# Patient Record
Sex: Female | Born: 2000 | Race: White | Hispanic: Yes | Marital: Single | Smoking: Never smoker
Health system: Southern US, Community
[De-identification: ages and names within clinical notes are randomized; demographics above are authoritative.]

---
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
## 2020-09-24 NOTE — L&D Delivery Note (Signed)
Delivery Note Progressed to complete dilation.  AROM clear fluid.  Pushed effectively to SVD  At 5:18 AM a viable and healthy female was delivered via Vaginal, Spontaneous (Presentation: Left Occiput Anterior).  APGAR: 9, 9; weight  .   Placenta status: Spontaneous,  .  Cord: 3 vessels with the following complications: None.    Anesthesia: Epidural Episiotomy: None Lacerations: 1st degree;Labial (bilateral) Suture Repair: 3.0 vicryl rapide Est. Blood Loss (mL): 300  Mom to postpartum.  Baby to Couplet care / Skin to Skin.  Alicia Jackson 06/25/2021, 6:26 AM   Please schedule this patient for Postpartum visit in: 4 weeks with the following provider: Any provider In-Person For C/S patients schedule nurse incision check in weeks 2 weeks: no Low risk pregnancy complicated by:  SGA Delivery mode:  SVD Anticipated Birth Control:  other/unsure PP Procedures needed:  none   Edinburgh: negative Schedule Integrated BH visit: no  No relevant baby issues

## 2020-10-26 IMAGING — US US OB LIMITED
1 series · 14 of 28 positions shown · non-contrast
Comparison: none

CLINICAL DATA: Initial evaluation for lower abdominal cramping,
early 18 weeks pregnant.

EXAM:
LIMITED OBSTETRIC ULTRASOUND

[Series 1: us ob limited · 35 acquisitions, 14 frames shown]
[im 2/35]
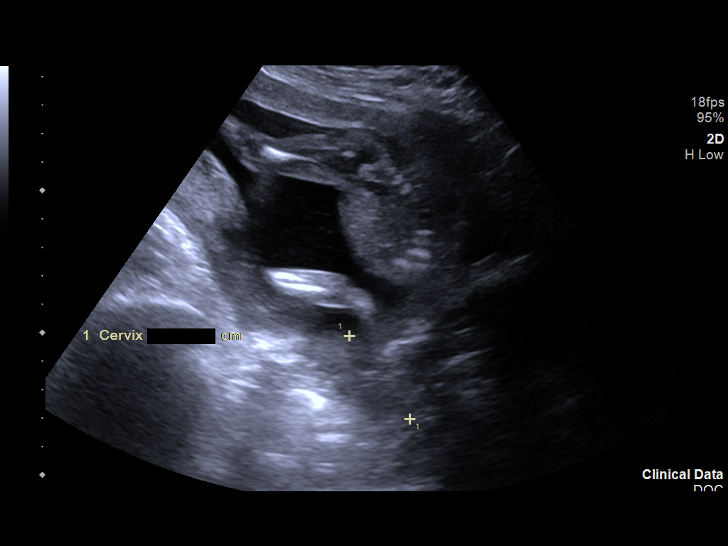
[im 4/35]
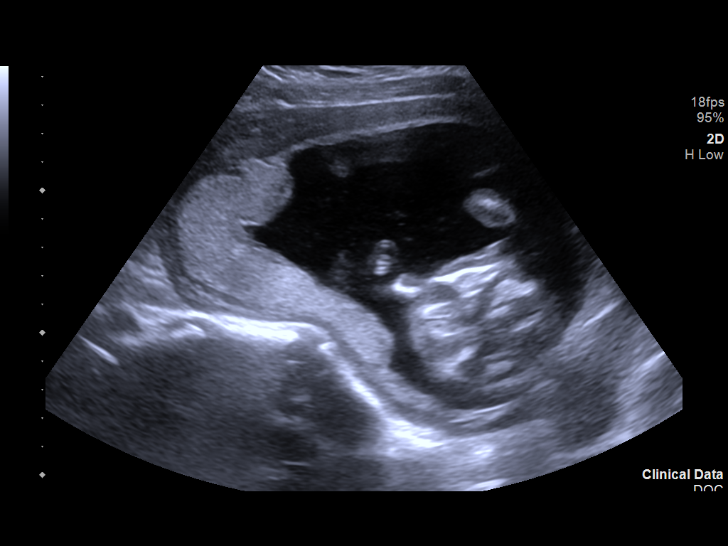
[im 7/35]
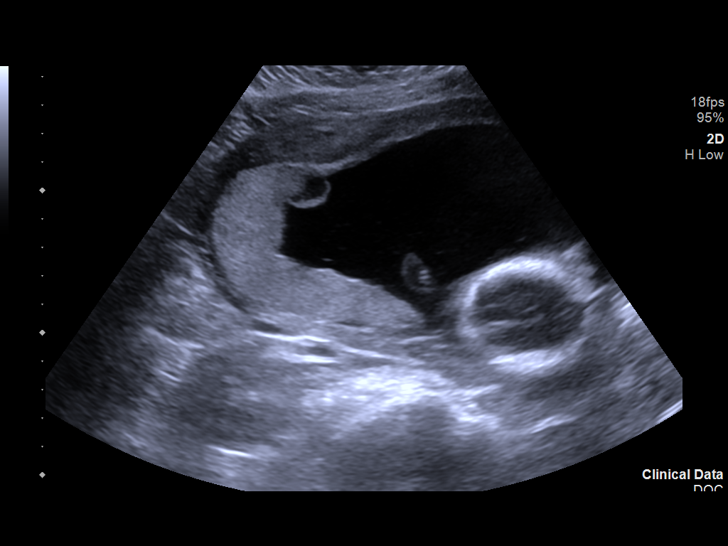
[im 9/35]
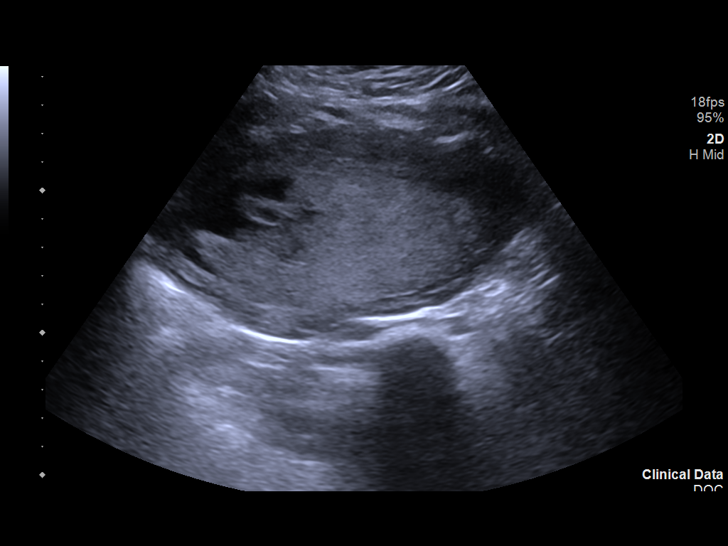
[im 12/35]
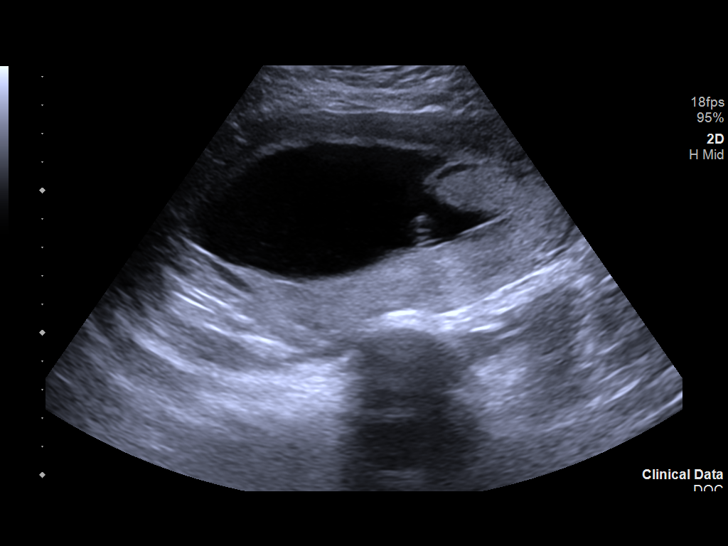
[im 14/35]
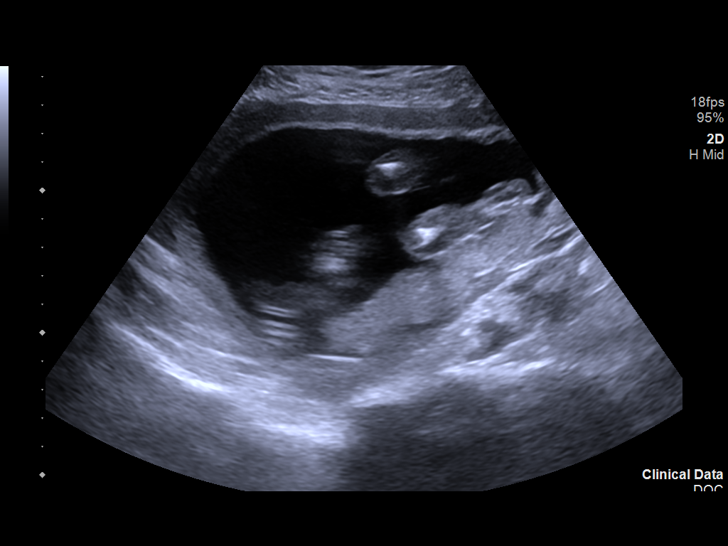
[im 17/35]
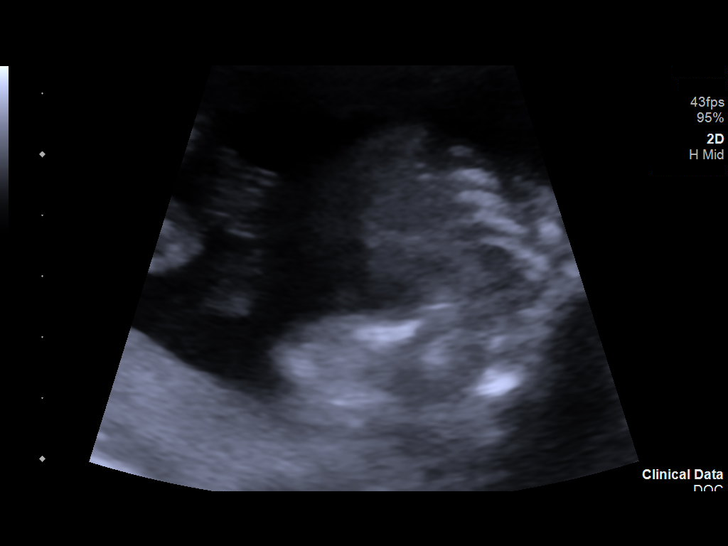
[im 19/35]
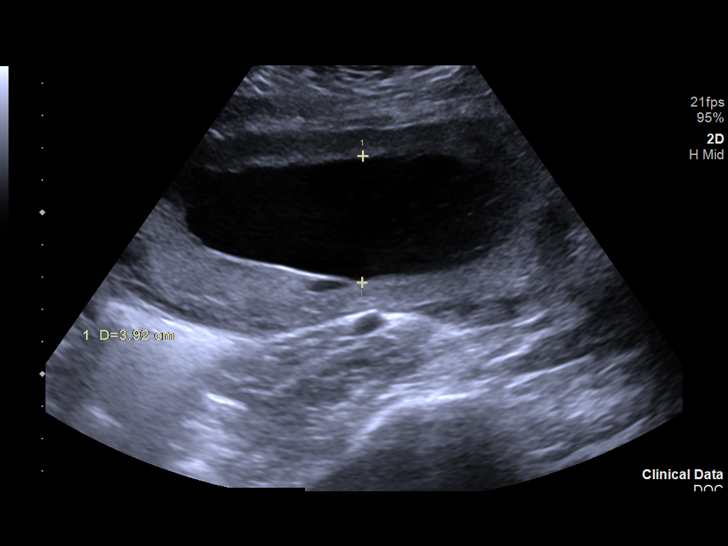
[im 22/35]
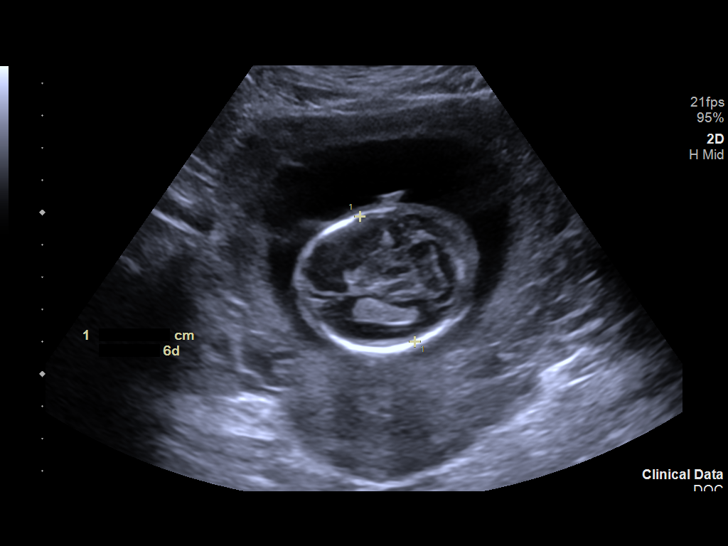
[im 24/35]
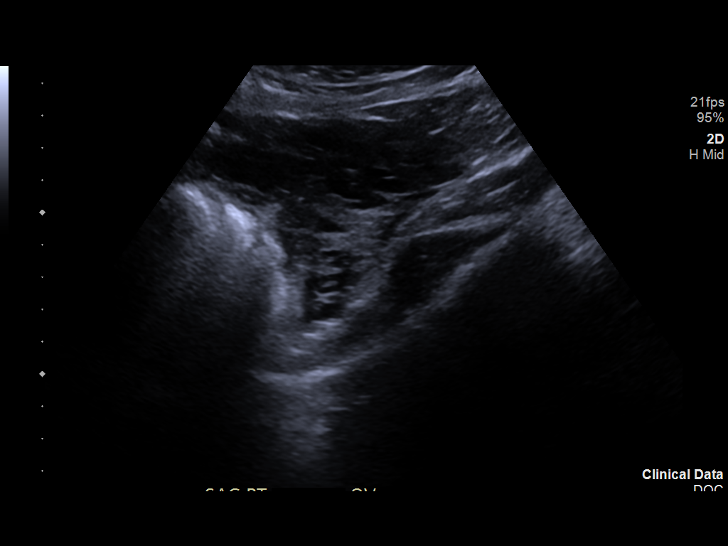
[im 27/35]
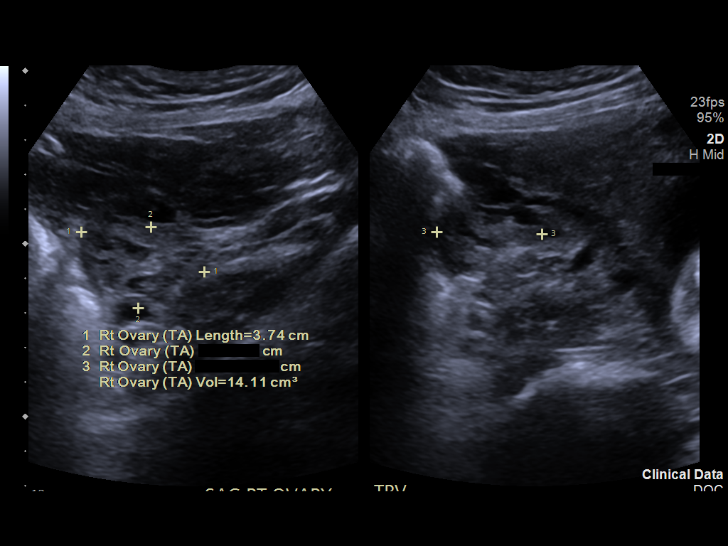
[im 29/35]
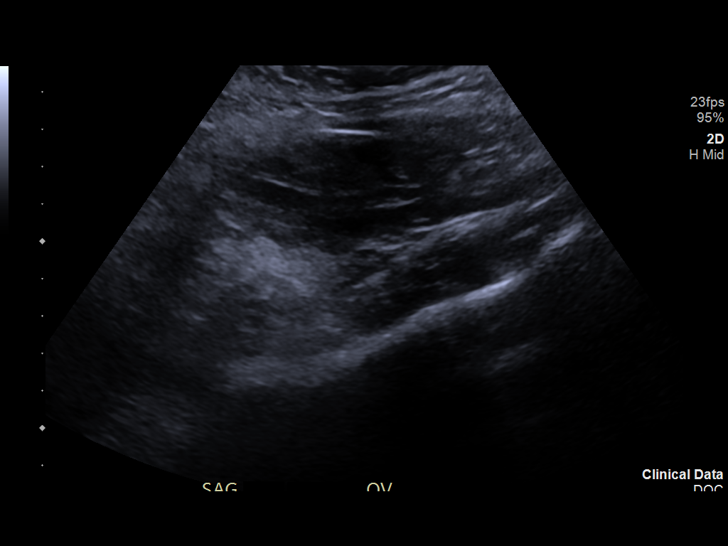
[im 32/35]
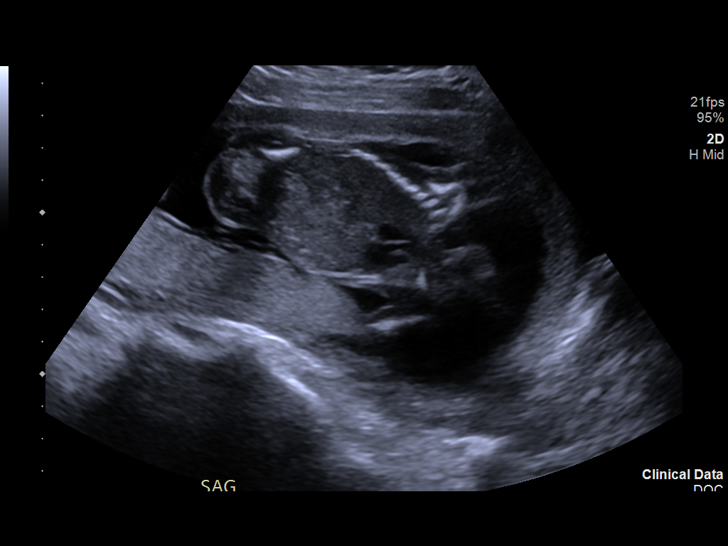
[im 35/35]
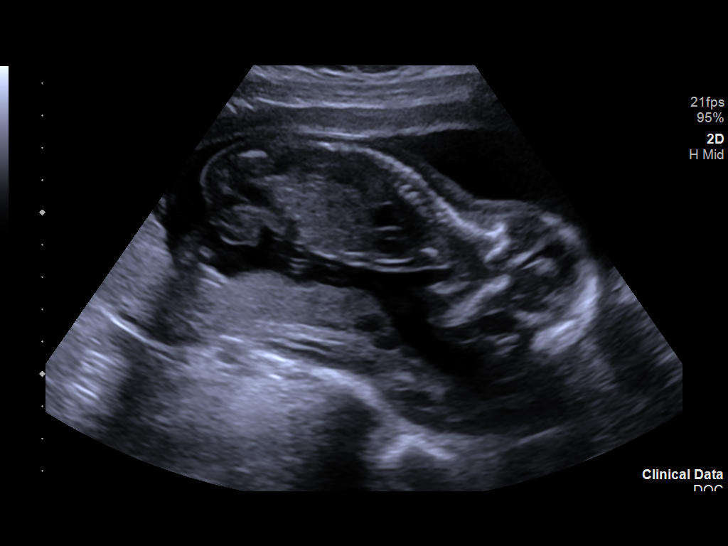

[14 of 28 positions shown; findings below may reference images not displayed]

FINDINGS: Number of Fetuses: 1

Heart Rate:  131 bpm

Movement: Present

Presentation: Cephalic

Placental Location: Fundal

Previa: Negative

Amniotic Fluid (Subjective):  Within normal limits.

AFI: 3.9 cm

BPD: 4.3 cm 18 w  6 d

MATERNAL FINDINGS:

Cervix:  Appears closed.

Uterus/Adnexae: No abnormality visualized. Right ovary normal in
appearance. Left ovary not visualized.
IMPRESSION: Single viable intrauterine pregnancy as above. No complication
identified.

This exam is performed on an emergent basis and does not
comprehensively evaluate fetal size, dating, or anatomy; follow-up
complete OB US should be considered if further fetal assessment is
warranted.

## 2021-01-10 LAB — OB RESULTS CONSOLE HEPATITIS B SURFACE ANTIGEN: Hepatitis B Surface Ag: NEGATIVE

## 2021-01-10 LAB — OB RESULTS CONSOLE RUBELLA ANTIBODY, IGM: Rubella: IMMUNE

## 2021-01-10 LAB — OB RESULTS CONSOLE RPR: RPR: NONREACTIVE

## 2021-01-10 LAB — OB RESULTS CONSOLE VARICELLA ZOSTER ANTIBODY, IGG: Varicella: IMMUNE

## 2021-01-10 LAB — OB RESULTS CONSOLE HIV ANTIBODY (ROUTINE TESTING): HIV: NONREACTIVE

## 2021-05-11 DIAGNOSIS — O26843 Uterine size-date discrepancy, third trimester: Secondary | ICD-10-CM | POA: Diagnosis present

## 2021-06-02 LAB — OB RESULTS CONSOLE GBS: GBS: NEGATIVE

## 2021-06-25 ENCOUNTER — Inpatient Hospital Stay (HOSPITAL_COMMUNITY)
Admission: AD | Admit: 2021-06-25 | Discharge: 2021-06-26 | DRG: 807 | Disposition: A | Discharge status: Home / Self Care | Payer: Medicaid Other | Attending: Obstetrics and Gynecology | Admitting: Obstetrics and Gynecology

## 2021-06-25 ENCOUNTER — Encounter (HOSPITAL_COMMUNITY): Payer: Self-pay | Admitting: Obstetrics and Gynecology

## 2021-06-25 ENCOUNTER — Other Ambulatory Visit: Payer: Self-pay

## 2021-06-25 ENCOUNTER — Inpatient Hospital Stay (HOSPITAL_COMMUNITY): Payer: Medicaid Other | Admitting: Anesthesiology

## 2021-06-25 DIAGNOSIS — Z20822 Contact with and (suspected) exposure to covid-19: Secondary | ICD-10-CM | POA: Diagnosis present

## 2021-06-25 DIAGNOSIS — O479 False labor, unspecified: Secondary | ICD-10-CM | POA: Diagnosis present

## 2021-06-25 DIAGNOSIS — Z3A39 39 weeks gestation of pregnancy: Secondary | ICD-10-CM

## 2021-06-25 DIAGNOSIS — O365930 Maternal care for other known or suspected poor fetal growth, third trimester, not applicable or unspecified: Principal | ICD-10-CM | POA: Diagnosis present

## 2021-06-25 DIAGNOSIS — O26843 Uterine size-date discrepancy, third trimester: Secondary | ICD-10-CM | POA: Diagnosis present

## 2021-06-25 DIAGNOSIS — O26893 Other specified pregnancy related conditions, third trimester: Secondary | ICD-10-CM | POA: Diagnosis present

## 2021-06-25 LAB — COMPREHENSIVE METABOLIC PANEL
ALT: 19 U/L (ref 0–44)
AST: 26 U/L (ref 15–41)
Albumin: 3.2 g/dL — ABNORMAL LOW (ref 3.5–5.0)
Alkaline Phosphatase: 172 U/L — ABNORMAL HIGH (ref 38–126)
Anion gap: 14 (ref 5–15)
BUN: 8 mg/dL (ref 6–20)
CO2: 15 mmol/L — ABNORMAL LOW (ref 22–32)
Calcium: 9 mg/dL (ref 8.9–10.3)
Chloride: 108 mmol/L (ref 98–111)
Creatinine, Ser: 0.5 mg/dL (ref 0.44–1.00)
GFR, Estimated: >60 mL/min (ref >60)
Glucose, Bld: 92 mg/dL (ref 70–99)
Potassium: 3.3 mmol/L — ABNORMAL LOW (ref 3.5–5.1)
Sodium: 137 mmol/L (ref 135–145)
Total Bilirubin: 0.6 mg/dL (ref 0.3–1.2)
Total Protein: 6.7 g/dL (ref 6.5–8.1)

## 2021-06-25 LAB — RESP PANEL BY RT-PCR (FLU A&B, COVID) ARPGX2
Influenza A by PCR: NEGATIVE
Influenza B by PCR: NEGATIVE
SARS Coronavirus 2 by RT PCR: NEGATIVE

## 2021-06-25 LAB — TYPE AND SCREEN
ABO/RH(D): A POS
Antibody Screen: NEGATIVE

## 2021-06-25 LAB — CBC
HCT: 38.4 % (ref 36.0–46.0)
Hemoglobin: 13.3 g/dL (ref 12.0–15.0)
MCH: 26.2 pg (ref 26.0–34.0)
MCHC: 34.6 g/dL (ref 30.0–36.0)
MCV: 75.7 fL — ABNORMAL LOW (ref 80.0–100.0)
Platelets: 273 10*3/uL (ref 150–400)
RBC: 5.07 MIL/uL (ref 3.87–5.11)
RDW: 14.6 % (ref 11.5–15.5)
WBC: 9.5 10*3/uL (ref 4.0–10.5)
nRBC: 0 % (ref 0.0–0.2)

## 2021-06-25 LAB — RPR: RPR Ser Ql: NONREACTIVE

## 2021-06-25 MED ORDER — ONDANSETRON HCL 4 MG PO TABS: 4.0000 mg | ORAL_TABLET | ORAL | Status: DC | PRN

## 2021-06-25 MED ORDER — LACTATED RINGERS IV SOLN: 500.0000 mL | Freq: Once | INTRAVENOUS | Status: DC

## 2021-06-25 MED ORDER — ONDANSETRON HCL 4 MG/2ML IJ SOLN: 4.0000 mg | INTRAMUSCULAR | Status: DC | PRN

## 2021-06-25 MED ORDER — SOD CITRATE-CITRIC ACID 500-334 MG/5ML PO SOLN
30.0000 mL | ORAL | Status: DC | PRN
Start: 2021-06-25 — End: 2021-06-25

## 2021-06-25 MED ORDER — ACETAMINOPHEN 325 MG PO TABS
650.0000 mg | ORAL_TABLET | ORAL | Status: DC | PRN
  Administered 2021-06-25: 650 mg via ORAL
  Filled 2021-06-25: qty 2

## 2021-06-25 MED ORDER — DIPHENHYDRAMINE HCL 25 MG PO CAPS: 25.0000 mg | ORAL_CAPSULE | Freq: Four times a day (QID) | ORAL | Status: DC | PRN

## 2021-06-25 MED ORDER — FENTANYL-BUPIVACAINE-NACL 0.5-0.125-0.9 MG/250ML-% EP SOLN
12.0000 mL/h | EPIDURAL | Status: DC | PRN
  Administered 2021-06-25: 10 mL/h via EPIDURAL
  Filled 2021-06-25: qty 250

## 2021-06-25 MED ORDER — EPHEDRINE 5 MG/ML INJ: 10.0000 mg | INTRAVENOUS | Status: DC | PRN

## 2021-06-25 MED ORDER — LACTATED RINGERS IV SOLN
500.0000 mL | Freq: Once | INTRAVENOUS | Status: AC
  Administered 2021-06-25: 500 mL via INTRAVENOUS

## 2021-06-25 MED ORDER — PHENYLEPHRINE 40 MCG/ML (10ML) SYRINGE FOR IV PUSH (FOR BLOOD PRESSURE SUPPORT)
80.0000 ug | PREFILLED_SYRINGE | INTRAVENOUS | Status: DC | PRN
Start: 2021-06-25 — End: 2021-06-25

## 2021-06-25 MED ORDER — OXYCODONE-ACETAMINOPHEN 5-325 MG PO TABS: 1.0000 | ORAL_TABLET | ORAL | Status: DC | PRN

## 2021-06-25 MED ORDER — BENZOCAINE-MENTHOL 20-0.5 % EX AERO
1.0000 "application " | INHALATION_SPRAY | CUTANEOUS | Status: DC | PRN
  Administered 2021-06-25: 1 via TOPICAL
  Filled 2021-06-25: qty 56

## 2021-06-25 MED ORDER — OXYTOCIN BOLUS FROM INFUSION
333.0000 mL | Freq: Once | INTRAVENOUS | Status: AC
  Administered 2021-06-25: 333 mL via INTRAVENOUS

## 2021-06-25 MED ORDER — ACETAMINOPHEN 325 MG PO TABS: 650.0000 mg | ORAL_TABLET | ORAL | Status: DC | PRN

## 2021-06-25 MED ORDER — DIPHENHYDRAMINE HCL 50 MG/ML IJ SOLN
12.5000 mg | INTRAMUSCULAR | Status: DC | PRN
Start: 2021-06-25 — End: 2021-06-25

## 2021-06-25 MED ORDER — PHENYLEPHRINE 40 MCG/ML (10ML) SYRINGE FOR IV PUSH (FOR BLOOD PRESSURE SUPPORT)
80.0000 ug | PREFILLED_SYRINGE | INTRAVENOUS | Status: DC | PRN
  Filled 2021-06-25: qty 10

## 2021-06-25 MED ORDER — BUPIVACAINE HCL (PF) 0.25 % IJ SOLN
INTRAMUSCULAR | Status: DC | PRN
  Administered 2021-06-25: 1 mL via INTRATHECAL

## 2021-06-25 MED ORDER — DIBUCAINE (PERIANAL) 1 % EX OINT
1.0000 | TOPICAL_OINTMENT | CUTANEOUS | Status: DC | PRN
Start: 2021-06-25 — End: 2021-06-26

## 2021-06-25 MED ORDER — IBUPROFEN 600 MG PO TABS
600.0000 mg | ORAL_TABLET | Freq: Four times a day (QID) | ORAL | Status: DC
Start: 2021-06-25 — End: 2021-06-26
  Administered 2021-06-25 – 2021-06-26 (×5): 600 mg via ORAL
  Filled 2021-06-25 (×5): qty 1

## 2021-06-25 MED ORDER — LACTATED RINGERS IV SOLN
INTRAVENOUS | Status: DC
Start: 2021-06-25 — End: 2021-06-25

## 2021-06-25 MED ORDER — ONDANSETRON HCL 4 MG/2ML IJ SOLN: 4.0000 mg | Freq: Four times a day (QID) | INTRAMUSCULAR | Status: DC | PRN

## 2021-06-25 MED ORDER — SENNOSIDES-DOCUSATE SODIUM 8.6-50 MG PO TABS
2.0000 | ORAL_TABLET | ORAL | Status: DC
Start: 2021-06-25 — End: 2021-06-26
  Administered 2021-06-25 – 2021-06-26 (×2): 2 via ORAL
  Filled 2021-06-25 (×2): qty 2

## 2021-06-25 MED ORDER — FENTANYL CITRATE (PF) 100 MCG/2ML IJ SOLN: 100.0000 ug | Freq: Once | INTRAMUSCULAR | Status: DC

## 2021-06-25 MED ORDER — LIDOCAINE HCL (PF) 1 % IJ SOLN
30.0000 mL | INTRAMUSCULAR | Status: DC | PRN
Start: 2021-06-25 — End: 2021-06-25

## 2021-06-25 MED ORDER — ZOLPIDEM TARTRATE 5 MG PO TABS: 5.0000 mg | ORAL_TABLET | Freq: Every evening | ORAL | Status: DC | PRN

## 2021-06-25 MED ORDER — OXYCODONE-ACETAMINOPHEN 5-325 MG PO TABS: 2.0000 | ORAL_TABLET | ORAL | Status: DC | PRN

## 2021-06-25 MED ORDER — COCONUT OIL OIL: 1.0000 "application " | TOPICAL_OIL | Status: DC | PRN

## 2021-06-25 MED ORDER — OXYTOCIN-SODIUM CHLORIDE 30-0.9 UT/500ML-% IV SOLN
2.5000 [IU]/h | INTRAVENOUS | Status: DC
  Administered 2021-06-25: 2.5 [IU]/h via INTRAVENOUS

## 2021-06-25 MED ORDER — TETANUS-DIPHTH-ACELL PERTUSSIS 5-2.5-18.5 LF-MCG/0.5 IM SUSY: 0.5000 mL | PREFILLED_SYRINGE | Freq: Once | INTRAMUSCULAR | Status: DC

## 2021-06-25 MED ORDER — WITCH HAZEL-GLYCERIN EX PADS
1.0000 | MEDICATED_PAD | CUTANEOUS | Status: DC | PRN
Start: 2021-06-25 — End: 2021-06-26

## 2021-06-25 MED ORDER — OXYTOCIN-SODIUM CHLORIDE 30-0.9 UT/500ML-% IV SOLN
INTRAVENOUS | Status: AC
  Filled 2021-06-25: qty 500

## 2021-06-25 MED ORDER — LACTATED RINGERS IV SOLN: 500.0000 mL | INTRAVENOUS | Status: DC | PRN

## 2021-06-25 MED ORDER — FENTANYL CITRATE (PF) 100 MCG/2ML IJ SOLN
INTRAMUSCULAR | Status: AC
  Administered 2021-06-25: 100 ug
  Filled 2021-06-25: qty 2

## 2021-06-25 MED ORDER — PRENATAL MULTIVITAMIN CH
1.0000 | ORAL_TABLET | Freq: Every day | ORAL | Status: DC
  Administered 2021-06-25 – 2021-06-26 (×2): 1 via ORAL
  Filled 2021-06-25 (×2): qty 1

## 2021-06-25 MED ORDER — SIMETHICONE 80 MG PO CHEW: 80.0000 mg | CHEWABLE_TABLET | ORAL | Status: DC | PRN

## 2021-06-25 NOTE — MAU Note (Signed)
To LD via WC 205

## 2021-06-25 NOTE — H&P (Signed)
Kayonna Lawniczak is a 20 y.o. female presenting for Active Labor at 7cm Last baby was SVD in 2020 with no complications  Got her prenatal care at [facility] but then moved to [facility] and has not started care here yet  Pregnancy has been remarkable for SGA (measured 34cm at 38wks) GBS is Neg. OB History     Gravida  2   Para      Term      Preterm      AB      Living         SAB      IAB      Ectopic      Multiple      Live Births             No past medical history on file. No past surgical history on file. Family History: family history is not on file. Social History:  has no history on file for tobacco use, alcohol use, and drug use.     Maternal Diabetes: No Genetic Screening: Normal Maternal Ultrasounds/Referrals: Normal Fetal Ultrasounds or other Referrals:  None Maternal Substance Abuse:  No Significant Maternal Medications:  None Significant Maternal Lab Results:  Group B Strep negative Other Comments:   SGA  Review of Systems  Constitutional:  Negative for chills, fatigue and fever.  Respiratory:  Negative for shortness of breath.   Gastrointestinal:  Positive for abdominal pain. Negative for constipation, diarrhea, nausea and vomiting.  Genitourinary:  Positive for pelvic pain. Negative for vaginal bleeding and vaginal discharge.  Maternal Medical History:  Reason for admission: Contractions.  Nausea.  Contractions: Onset was 3-5 hours ago.   Frequency: regular.   Perceived severity is strong.   Fetal activity: Perceived fetal activity is normal.   Prenatal complications: No bleeding, PIH, infection, placental abnormality, pre-eclampsia or preterm labor.   Prenatal Complications - Diabetes: none.    Blood pressure (!) 144/72, temperature 97.7 F (36.5 C), temperature source Oral, resp. rate 20, unknown if currently breastfeeding. Maternal Exam:  Uterine Assessment: Contraction strength is firm.  Contraction frequency is regular.   Abdomen: Patient reports no abdominal tenderness. Fetal presentation: vertex Introitus: Normal vulva. Normal vagina.  Pelvis: adequate for delivery.   Cervix: Cervix evaluated by digital exam.     Fetal Exam Fetal Monitor Review: Mode: ultrasound.   Baseline rate: 140.  Variability: moderate (6-25 bpm).   Pattern: accelerations present and no decelerations.   Fetal State Assessment: Category I - tracings are normal.  Physical Exam Constitutional:      General: She is not in acute distress.    Appearance: She is normal weight. She is not ill-appearing or toxic-appearing.  HENT:     Head: Normocephalic.  Cardiovascular:     Rate and Rhythm: Normal rate.  Pulmonary:     Effort: Pulmonary effort is normal.  Abdominal:     General: There is no distension.     Tenderness: There is no abdominal tenderness. There is no guarding.  Genitourinary:    General: Normal vulva.     Comments: Cervix 7cm/100/0/vertex Musculoskeletal:        General: Normal range of motion.     Cervical back: Normal range of motion.  Skin:    General: Skin is warm and dry.  Neurological:     General: No focal deficit present.     Mental Status: She is alert.  Psychiatric:        Mood and Affect: Mood normal.  Prenatal labs: ABO, Rh:   Antibody:   Rubella:   RPR:    HBsAg:    HIV:    GBS:     Assessment/Plan: Single IUP at [redacted]w[redacted]d Active Labor SGA  Admit to Labor and Delivery Routine orders Anticipate SVD   Wynelle Bourgeois 06/25/2021, 3:43 AM

## 2021-06-25 NOTE — MAU Note (Signed)
Pt reports ctx 's since Friday.  Pt reports painful ctx's since 2000.   Reports +FM   Denies LOF, reports blood tinged mucous.

## 2021-06-25 NOTE — Anesthesia Procedure Notes (Signed)
Epidural Patient location during procedure: OB Start time: 06/25/2021 4:17 AM End time: 06/25/2021 4:20 AM  Staffing Anesthesiologist: Kaylyn Layer, MD Performed: anesthesiologist   Preanesthetic Checklist Completed: patient identified, IV checked, risks and benefits discussed, monitors and equipment checked, pre-op evaluation and timeout performed  Epidural Patient position: sitting Prep: DuraPrep and site prepped and draped Patient monitoring: heart rate, continuous pulse ox and blood pressure Approach: midline Location: L3-L4 Injection technique: LOR air  Needle:  Needle type: Tuohy  Needle gauge: 17 G Needle length: 9 cm Needle insertion depth: 6 cm Catheter type: closed end flexible Catheter size: 19 Gauge Catheter at skin depth: 11 cm  Assessment Events: blood not aspirated, injection not painful, no injection resistance, no paresthesia and negative IV test  Additional Notes Patient identified. Risks, benefits, and alternatives discussed with patient including but not limited to bleeding, infection, nerve damage, paralysis, failed block, incomplete pain control, headache, blood pressure changes, nausea, vomiting, reactions to medication, itching, and postpartum back pain. Confirmed with bedside nurse the patient's most recent platelet count. Confirmed with patient that they are not currently taking any anticoagulation, have any bleeding history, or any family history of bleeding disorders. Patient expressed understanding and wished to proceed. All questions were answered. Sterile technique was used throughout the entire procedure. Please see nursing notes for vital signs.   Crisp LOR with Tuohy needle on first pass. [place] 25g spinal needle introduced through Tuohy with clear CSF return prior to injection of intrathecal medication. Spinal needle withdrawn and epidural catheter threaded easily. Negative aspiration of catheter for heme or CSF prior to starting  epidural infusion. Warning signs of high block given to the patient including shortness of breath, tingling/numbness in hands, complete motor block, or any concerning symptoms with instructions to call for help. Patient was given instructions on fall risk and not to get out of bed. All questions and concerns addressed with instructions to call with any issues or inadequate analgesia.  Patient comfortable with contractions prior to my leaving room. Reason for block:procedure for pain

## 2021-06-25 NOTE — Discharge Summary (Signed)
Postpartum Discharge Summary    Patient Name: Alicia Jackson DOB: 2000/09/28 MRN: [id number]  Date of admission: 06/25/2021 Delivery date:06/25/2021  Delivering provider: Seabron Spates  Date of discharge: 06/26/2021  Admitting diagnosis: Uterine contractions during pregnancy [O47.9] Intrauterine pregnancy: [redacted]w[redacted]d    Secondary diagnosis:  Active Problems:   Uterine contractions during pregnancy   Uterine size-date discrepancy in third trimester   Vaginal delivery  Additional problems: SGA    Discharge diagnosis: Term Pregnancy Delivered and SGA                                               Post partum procedures: None Augmentation: AROM Complications: None  Hospital course: Onset of Labor With Vaginal Delivery      20y.o. yo G3P1010 at 20w5das admitted in Active Labor on 06/25/2021. Patient had an uncomplicated labor course as follows:  Membrane Rupture Time/Date: 5:08 AM ,06/25/2021   Delivery Method:Vaginal, Spontaneous  Episiotomy: None  Lacerations:  1st degree;Labial  Arrived at 7cm  Got epidural for pain and delivered soon thereafter  Patient had an uncomplicated postpartum course.  She is ambulating, tolerating a regular diet, passing flatus, and urinating well. Patient is discharged home in stable condition on 06/26/21.  Newborn Data: Birth date:06/25/2021  Birth time:5:18 AM  Gender:Female  Living status:Living  Apgars:9 ,9  Weight:2825 g   Magnesium Sulfate received: No BMZ received: No Rhophylac:N/A MMR:N/A T-DaP:Given prenatally Flu: N/A Transfusion:No  Physical exam  Vitals:   06/25/21 1358 06/25/21 1706 06/25/21 2300 06/26/21 0500  BP: (!) 104/59 111/77 102/60 100/63  Pulse: 74 (!) 57 65 62  Resp: '16 17 18 18  ' Temp: 98.1 F (36.7 C) 98.1 F (36.7 C) 97.9 F (36.6 C) 98 F (36.7 C)  TempSrc: Oral Oral Oral Oral  SpO2: 100% 99% 100% 100%  Weight:      Height:       General: alert Lochia: appropriate Uterine Fundus: firm DVT  Evaluation: No evidence of DVT seen on physical exam. Labs: Lab Results  Component Value Date   WBC 9.5 06/25/2021   HGB 13.3 06/25/2021   HCT 38.4 06/25/2021   MCV 75.7 (L) 06/25/2021   PLT 273 06/25/2021   CMP Latest Ref Rng & Units 06/25/2021  Glucose 70 - 99 mg/dL 92  BUN 6 - 20 mg/dL 8  Creatinine 0.44 - 1.00 mg/dL 0.50  Sodium 135 - 145 mmol/L 137  Potassium 3.5 - 5.1 mmol/L 3.3(L)  Chloride 98 - 111 mmol/L 108  CO2 22 - 32 mmol/L 15(L)  Calcium 8.9 - 10.3 mg/dL 9.0  Total Protein 6.5 - 8.1 g/dL 6.7  Total Bilirubin 0.3 - 1.2 mg/dL 0.6  Alkaline Phos 38 - 126 U/L 172(H)  AST 15 - 41 U/L 26  ALT 0 - 44 U/L 19   Edinburgh Score: No flowsheet data found.    After visit meds:  Allergies as of 06/26/2021   No Known Allergies      Medication List     TAKE these medications    acetaminophen 325 MG tablet Commonly known as: Tylenol Take 2 tablets (650 mg total) by mouth every 4 (four) hours as needed (for pain scale < 4).   ibuprofen 600 MG tablet Commonly known as: ADVIL Take 1 tablet (600 mg total) by mouth every 6 (six) hours.  Discharge home in stable condition Message sent to [facility] by Dr. Cy Blamer on 10/3  Infant Feeding: Bottle and Breast Infant Disposition:home with mother Discharge instruction: per After Visit Summary and Postpartum booklet. Activity: Advance as tolerated. Pelvic rest for 6 weeks.  Diet: routine diet Anticipated Birth Control:  Declines, states not with partner any more Postpartum Appointment:4 weeks Additional Postpartum F/U:  None Future Appointments:No future appointments.    Renard Matter, MD, MPH OB Fellow, Faculty Practice

## 2021-06-25 NOTE — Anesthesia Postprocedure Evaluation (Signed)
Anesthesia Post Note  Patient: Alicia Jackson  Procedure(s) Performed: AN AD HOC LABOR EPIDURAL     Patient location during evaluation: Mother Baby Anesthesia Type: Epidural Level of consciousness: awake and alert and oriented Pain management: satisfactory to patient Vital Signs Assessment: post-procedure vital signs reviewed and stable Respiratory status: respiratory function stable Cardiovascular status: stable Postop Assessment: no headache, no backache, epidural receding, patient able to bend at knees, no signs of nausea or vomiting, adequate PO intake and able to ambulate Anesthetic complications: no   No notable events documented.  Last Vitals:  Vitals:   06/25/21 0920 06/25/21 1358  BP: 111/62 (!) 104/59  Pulse: 74 74  Resp: 16 16  Temp: 36.5 C 36.7 C  SpO2: 99% 100%    Last Pain:  Vitals:   06/25/21 1358  TempSrc: Oral  PainSc: 0-No pain   Pain Goal: Patients Stated Pain Goal: 3 (06/25/21 0920)                 Karleen Dolphin

## 2021-06-25 NOTE — Lactation Note (Signed)
This note was copied from a baby's chart. Lactation Consultation Note  Patient Name: Alicia Jackson Today's Date: 06/25/2021 Reason for consult: L&D Initial assessment;Term;Primapara;1st time breastfeeding Age:20 hours   Initial L&D Consult:  RN in room; mother declined lactation services at this time.  She would like follow up on the M/B unit.   Maternal Data    Feeding Mother's Current Feeding Choice: Breast Milk and Formula  LATCH Score                    Lactation Tools Discussed/Used    Interventions    Discharge    Consult Status Consult Status: Follow-up from L&D    Paxson Harrower R Nishawn Rotan 06/25/2021, 5:50 AM

## 2021-06-25 NOTE — MAU Note (Signed)
To room to place pt on external fetal and labor monitors and complete MAU assessment. Pt pacing at bedside - stating she feels continuous pressure -pt assisted to bed for SVE. 8/100/0 station/vertex/ external monitors applied-pt states she is unable to lie down and must stand. Charge notified of advanced dilitation- Wynelle Bourgeois CNM notified and orders received for admission-LD charge called-to go to room 205

## 2021-06-25 NOTE — Anesthesia Preprocedure Evaluation (Signed)
Anesthesia Evaluation  Patient identified by MRN, date of birth, ID band Patient awake    Reviewed: Allergy & Precautions, Patient's Chart, lab work & pertinent test results  History of Anesthesia Complications Negative for: history of anesthetic complications  Airway Mallampati: II  TM Distance: >3 FB Neck ROM: Full    Dental no notable dental hx.    Pulmonary neg pulmonary ROS,    Pulmonary exam normal        Cardiovascular negative cardio ROS Normal cardiovascular exam     Neuro/Psych negative neurological ROS  negative psych ROS   GI/Hepatic negative GI ROS, Neg liver ROS,   Endo/Other  negative endocrine ROS  Renal/GU negative Renal ROS  negative genitourinary   Musculoskeletal negative musculoskeletal ROS (+)   Abdominal   Peds  Hematology negative hematology ROS (+)   Anesthesia Other Findings Day of surgery medications reviewed with patient.  Reproductive/Obstetrics (+) Pregnancy                             Anesthesia Physical Anesthesia Plan  ASA: 2  Anesthesia Plan: Combined Spinal and Epidural   Post-op Pain Management:    Induction:   PONV Risk Score and Plan: Treatment may vary due to age or medical condition  Airway Management Planned: Natural Airway  Additional Equipment:   Intra-op Plan:   Post-operative Plan:   Informed Consent: I have reviewed the patients History and Physical, chart, labs and discussed the procedure including the risks, benefits and alternatives for the proposed anesthesia with the patient or authorized representative who has indicated his/her understanding and acceptance.       Plan Discussed with:   Anesthesia Plan Comments:         Anesthesia Quick Evaluation  

## 2021-06-26 ENCOUNTER — Other Ambulatory Visit (HOSPITAL_COMMUNITY): Payer: Self-pay

## 2021-06-26 MED ORDER — ACETAMINOPHEN 325 MG PO TABS
650.0000 mg | ORAL_TABLET | ORAL | 0 refills | Status: AC | PRN
  Filled 2021-06-26: qty 60, 5d supply, fill #0

## 2021-06-26 MED ORDER — IBUPROFEN 600 MG PO TABS
600.0000 mg | ORAL_TABLET | Freq: Four times a day (QID) | ORAL | 0 refills | Status: AC
  Filled 2021-06-26: qty 30, 8d supply, fill #0

## 2021-06-26 NOTE — Social Work (Signed)
CSW received consult for Pt requests resources for infant. She states she has a car seat. FOB in jail. Pt states she feels safe but has no eye contact when responding to safety question per RN. CSW met with MOB to offer support and complete assessment.    CSW met with MOB and introduced CSW role. CSW observed MOB holding the infant. MOB presented calm and receptive to CSW visit. CSW inquired how MOB has felt since giving birth. MOB expressed feeling good with a good L&D. CSW inquired how MOB felt during the pregnancy. MOB shared she had normal feelings during pregnancy. CSW inquired if MOB has a history of anxiety or depression. MOB denied anxiety and depression. CSW inquired if MOB experienced postpartum depression with her older child. MOB shared she felt " a little more sensitive to things, I was angrier about stuff." CSW educated MOB that anger is a symptoms of PPD. CSW provided education regarding the baby blues period vs. perinatal mood disorders, discussed treatment and gave resources for mental health follow up if concerns arise. CSW recommended MOB complete a self-evaluation during the postpartum time period using the New Mom Checklist from Postpartum Progress and encouraged MOB to contact a medical professional if symptoms are noted at any time. MOB shared she feels comfortable reaching out to her doctor if concerns arise. CSW assessed MOB for safety.  MOB denied thoughts of harm to self and others. MOB denied domestic violence concerns. CSW inquired about MOB supports. MOB identified her parents as supports. CSW inquired about FOB. MOB disclosed FOB is currently serving time in jail but will be involved with her and the infant once he is released.   CSW inquired if MOB has essential items for the infant to discharge home. MOB pointed to the car seat in the room and shared she has some diapers and wipes. CSW educated MOB about [facility]. MOB gave CSW permission to make a referral to [facility]. MOB shared  she does not have a place for the infant to sleep. CSW educated MOB about safe sleep and provided review of Sudden Infant Death Syndrome (SIDS) precautions. CSW offered MOB a pack in play. MOB accepted. MOB has chosen [facility] for infant's follow up care. MOB confirmed she has transportation for the appointment tomorrow however she will need transportation assistance for further appointments. CSW educated MOB about [facility] [employer] [facility]. MOB completed the [employer] Waiver. MOB shared she receives WIC/FS benefits. CSW assessed MOB for additional needs. MOB reported no further needs.    CSW identifies no further need for intervention and no barriers to discharge at this time.   Melenda Bielak, MSW, LCSW [facility]  Clinical Social Worker  [phone] 06/26/2021  1:38 PM  

## 2021-06-26 NOTE — Plan of Care (Signed)
Discharge instructions given to patient and AVS received. Pt receptive to all teaching.

## 2021-06-26 NOTE — Lactation Note (Addendum)
This note was copied from a baby's chart. Lactation Consultation Note  Patient Name: Alicia Jackson ALPFX'T Date: 06/26/2021 Reason for consult: Follow-up assessment;Mother's request;Difficult latch;Term Age:20 hours  LC worked with Mom to increase volume with breast compression, infant feeding every hr on hr. Mom can use more time to work on getting deeper latch, compression stripes noted.   Plan 1. To feed based on cues 8-12x 24 hr period. Mom to offer breast and look for signs of milk transfer. Infant still hungry after latching, need supplementation.  2. Mom to pump with manual q 3 hrs for 10 min each breast.  3. Pace bottle feeding reviewed with yellow slow flow nipple. BF supplementation guide provided.   All questions answered at the end of the visit. LC discussed finding with provider, NP L Rafeek. Mom to work on feeding for next few hrs.   Maternal Data Has patient been taught Hand Expression?: Yes Does the patient have breastfeeding experience prior to this delivery?: Yes How long did the patient breastfeed?: 1 year  Feeding Mother's Current Feeding Choice: Breast Milk  LATCH Score Latch: Repeated attempts needed to sustain latch, nipple held in mouth throughout feeding, stimulation needed to elicit sucking reflex.  Audible Swallowing: Spontaneous and intermittent  Type of Nipple: Everted at rest and after stimulation  Comfort (Breast/Nipple): Soft / non-tender  Hold (Positioning): Assistance needed to correctly position infant at breast and maintain latch.  LATCH Score: 8   Lactation Tools Discussed/Used Tools: Pump;Flanges Flange Size: 21 Breast pump type: Manual Pump Education: Setup, frequency, and cleaning;Milk Storage Reason for Pumping: increase stimulation Pumping frequency: every 3 hrs for 10 min each breast  Interventions Interventions: Breast feeding basics reviewed;Assisted with latch;Skin to skin;Breast massage;Hand express;Breast  compression;Adjust position;Support pillows;Position options;Expressed milk;Hand pump;Education;Pace feeding;LC Psychologist, educational;Infant Driven Feeding Algorithm education  Discharge Discharge Education: Engorgement and breast care;Warning signs for feeding baby Pump: Manual WIC Program: No  Consult Status Consult Status: Follow-up Date: 06/27/21 Follow-up type: In-patient    Alicia Jackson  Alicia Jackson 06/26/2021, 12:50 PM

## 2021-07-05 ENCOUNTER — Telehealth (HOSPITAL_COMMUNITY): Payer: Self-pay | Admitting: *Deleted

## 2021-07-05 NOTE — Telephone Encounter (Signed)
Attempted hospital discharge follow-up call. Patient not available at the time of the call. Deforest Hoyles, RN, 07/05/21, [phone]

## 2021-08-02 ENCOUNTER — Ambulatory Visit: Payer: Self-pay | Admitting: Obstetrics and Gynecology
